# Patient Record
Sex: Male | Born: 1940 | Race: White | Hispanic: No | Marital: Single | Smoking: Former smoker
Health system: Southern US, Community
[De-identification: ages and names within clinical notes are randomized; demographics above are authoritative.]

## PROBLEM LIST (undated history)

## (undated) DIAGNOSIS — K220 Achalasia of cardia: Secondary | ICD-10-CM

## (undated) DIAGNOSIS — J189 Pneumonia, unspecified organism: Secondary | ICD-10-CM

## (undated) HISTORY — DX: Pneumonia, unspecified organism: J18.9

## (undated) HISTORY — DX: Achalasia of cardia: K22.0

---

## 2017-12-13 ENCOUNTER — Other Ambulatory Visit (INDEPENDENT_AMBULATORY_CARE_PROVIDER_SITE_OTHER): Payer: Medicare Other

## 2017-12-13 ENCOUNTER — Ambulatory Visit (INDEPENDENT_AMBULATORY_CARE_PROVIDER_SITE_OTHER)
Admission: RE | Admit: 2017-12-13 | Discharge: 2017-12-13 | Disposition: A | Discharge status: Home / Self Care | Payer: Medicare Other | Source: Ambulatory Visit | Attending: Internal Medicine | Admitting: Internal Medicine

## 2017-12-13 ENCOUNTER — Ambulatory Visit (INDEPENDENT_AMBULATORY_CARE_PROVIDER_SITE_OTHER): Payer: Medicare Other | Admitting: Internal Medicine

## 2017-12-13 ENCOUNTER — Encounter: Payer: Self-pay | Admitting: Internal Medicine

## 2017-12-13 VITALS — BP 120/76 | HR 55 | Ht 66.0 in | Wt 133.0 lb

## 2017-12-13 DIAGNOSIS — J479 Bronchiectasis, uncomplicated: Secondary | ICD-10-CM | POA: Diagnosis not present

## 2017-12-13 LAB — CBC WITH DIFFERENTIAL/PLATELET
BASOS ABS: 0.1 10*3/uL (ref 0.0–0.1)
Basophils Relative: 0.7 % (ref 0.0–3.0)
EOS ABS: 0.3 10*3/uL (ref 0.0–0.7)
Eosinophils Relative: 3.7 % (ref 0.0–5.0)
HCT: 39.9 % (ref 39.0–52.0)
Hemoglobin: 13.1 g/dL (ref 13.0–17.0)
LYMPHS ABS: 2.6 10*3/uL (ref 0.7–4.0)
Lymphocytes Relative: 32.6 % (ref 12.0–46.0)
MCHC: 32.7 g/dL (ref 30.0–36.0)
MCV: 90.8 fl (ref 78.0–100.0)
MONO ABS: 0.6 10*3/uL (ref 0.1–1.0)
MONOS PCT: 7.9 % (ref 3.0–12.0)
NEUTROS ABS: 4.4 10*3/uL (ref 1.4–7.7)
NEUTROS PCT: 55.1 % (ref 43.0–77.0)
PLATELETS: 232 10*3/uL (ref 150.0–400.0)
RBC: 4.39 Mil/uL (ref 4.22–5.81)
RDW: 15 % (ref 11.5–15.5)
WBC: 8 10*3/uL (ref 4.0–10.5)

## 2017-12-13 LAB — SEDIMENTATION RATE: SED RATE: 36 mm/h — AB (ref 0–20)

## 2017-12-13 NOTE — Patient Instructions (Addendum)
GERD (REFLUX)  is an extremely common cause of respiratory symptoms just like yours , many times with no obvious heartburn at all.    It can be treated with medication, but also with lifestyle changes including elevation of the head of your bed (ideally with 6 inch  bed blocks),  Smoking cessation, avoidance of late meals, excessive alcohol, and avoid fatty foods, chocolate, peppermint, colas, red wine, and acidic juices such as orange juice.  NO MINT OR MENTHOL PRODUCTS SO NO COUGH DROPS   USE SUGARLESS CANDY INSTEAD (Jolley ranchers or Stover's or Life Savers) or even ice chips will also do - the key is to swallow to prevent all throat clearing. NO OIL BASED VITAMINS - use powdered substitutes.   Pantoprazole (protonix) 40 mg   Take  30-60 min before first meal of the day and Pepcid (famotidine)  20 mg / zantac 150 one @  bedtime until return to office - this is the best way to tell whether stomach acid is contributing to your problem.      Bronchiectasis =   you have scarring of your bronchial tubes which means that they don't function perfectly normally and mucus tends to pool in certain areas of your lung which can cause pneumonia and further scarring of your lung and bronchial tubes  Whenever you develop cough congestion take mucinex or mucinex dm > these will help keep the mucus loose and flowing but if your condition worsens you need to seek help immediately preferably here or somewhere inside the [facility] system to compare xrays ( worse = darker or bloody mucus or pain on breathing in)     Please remember to go to the lab and x-ray department downstairs in the basement  for your tests - we will call you with the results when they are available.     Please schedule a follow up office visit in 6 weeks, call sooner if needed pfts  On return

## 2017-12-13 NOTE — Progress Notes (Signed)
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 80% better   With the residual seasonal rhinitis fall = spring s tendency to sinus  infections s asthma or need for inhalers  but around age 77 onset dysphagia > dx achalasia [facility] s/p dilations and maint on gerd rx / careful with diet >>  2016 noted variable avg qod sensation of needing to cough when bending over = a tsp or two white thick mucus > CT chest 09/07/14 bilateral lower lobe bronchiectasis but no change in meds then really bad cough mid August 2019 and gen chest tightness > dx as pna rx with 2 different  abx and feeling better but still abn cxr 11/28/17 so referred to pulmonary clinic.     History of Present Illness  78/06/2017  Pulmonary / 1st office eval  Chief Complaint  Patient presents with  . Pulmonary Consult    Self referral.  Pt states he was dxed with PNA approx 6 wks ago. He states today he feels "the best I have felt in a year". He has minimal congestion "in my bronchial area"- non coughing up anything.   Dyspnea:  basoon capacity "75%" of what it used to be /  o/w Not limited by breathing from desired activities   Cough: less than it's been in a year Sleep: L side/ 6 in blocks 2 pillows SABA use: once a week for tightness    No obvious day to day or daytime variability or assoc excess/ purulent sputum or mucus plugs or hemoptysis or cp or chest tightness, subjective wheeze or overt sinus or hb symptoms.   Sleeping as above  without nocturnal  or early am exacerbation  of respiratory  c/o's or need for noct saba. Also denies any obvious fluctuation of symptoms with weather or environmental changes or other aggravating or alleviating factors except as outlined above   No unusual exposure hx or h/o childhood  pna/ asthma or knowledge of premature birth.  Current Allergies, Complete Past Medical History, Past Surgical History, Family History, and Social History were reviewed in [facility] record.  ROS  The following are not active complaints unless bolded Hoarseness, sore throat, dysphagia, dental problems, itching, sneezing,  nasal congestion or discharge of excess mucus or purulent secretions, ear ache,   fever, chills, sweats, unintended wt loss or wt gain, classically pleuritic or exertional cp,  orthopnea pnd or arm/hand swelling  or leg swelling, presyncope, palpitations, abdominal pain, anorexia, nausea, vomiting, diarrhea  or change in bowel habits or change in bladder habits, change in stools or change in urine, dysuria, hematuria,  rash, arthralgias, visual complaints, headache, numbness, weakness or ataxia or problems with walking or coordination,  change in mood or  memory.             Past Medical History:  Diagnosis Date  . Achalasia   . PNA (pneumonia)     Outpatient Medications Prior to Visit  Medication Sig Dispense Refill  . albuterol (PROVENTIL HFA;VENTOLIN HFA) 108 (90 Base) MCG/ACT inhaler Inhale 2 puffs into the lungs every 4 (four) hours as needed.    Marland Kitchen aspirin EC 81 MG tablet Take 1 tablet by mouth daily.    Marland Kitchen atenolol (TENORMIN) 25 MG tablet Take  25 mg by mouth 2 (two) times daily.    Marland Kitchen atorvastatin (LIPITOR) 80 MG tablet Take 1 tablet by mouth daily.    . Cholecalciferol (VITAMIN D-1000 MAX ST) 1000 units tablet Take 1 capsule by mouth daily.    . IRON PO Take 1 tablet by mouth every 7 (seven) days.    Marland Kitchen KRILL OIL PO Take 1 capsule by mouth daily.    . multivitamin-lutein (OCUVITE-LUTEIN) CAPS capsule Take 1 capsule by mouth daily.    Marland Kitchen PAIN RELIEVER EXTRA STRENGTH 500 MG tablet Take 1 tablet by mouth every 6 (six) hours as needed.  0  . pantoprazole (PROTONIX) 40 MG tablet Take 1 tablet by mouth daily.    . potassium chloride (K-DUR,KLOR-CON)  10 MEQ tablet Take 1 tablet by mouth daily.  0  . ranitidine (ZANTAC) 150 MG tablet Take 1 tablet by mouth daily.               Objective:     BP 120/76 (BP Location: Left Arm, Cuff Size: Normal)   Pulse (!) 55   Ht 5\' 6"  (1.676 m)   Wt 133 lb (60.3 kg)   SpO2 99%   BMI 21.47 kg/m   SpO2: 99 %  RA   HEENT: nl dentition, turbinates bilaterally, and oropharynx. Nl external ear canals without cough reflex   NECK :  without JVD/Nodes/TM/ nl carotid upstrokes bilaterally   LUNGS: no acc muscle use,  Nl contour chest with minimal insp / exp rhonchi  bilaterally without cough on insp or exp maneuvers   CV:  RRR  no s3 or murmur or increase in P2, and  Trace pedal edema bilaterally   ABD:  soft and nontender with nl inspiratory excursion in the supine position. No bruits or organomegaly appreciated, bowel sounds nl  MS:  Nl gait/ ext warm without deformities, calf tenderness, cyanosis or clubbing No obvious joint restrictions   SKIN: warm and dry without lesions    NEURO:  alert, approp, nl sensorium with  no motor or cerebellar deficits apparent.     Labs ordered 12/13/2017   Alpha one screen, quant Ig's,  Quant GOLD Tb, allergy profile    CXR PA and Lateral:   12/13/2017 :    I personally reviewed images and agree with radiology impression as follows:    Mild bibasilar subsegmental atelectasis/infiltrate again noted. A component of bronchiectasis may be present. Tiny right pleural effusion again noted. Is noted on prior exam.         Assessment   Bronchiectasis without complication (HCC)  CT chest 09/07/14  bilateral lower lobe bronchiectasis assoc with obvious achalasia  - 12/13/2017 Alpha one screen>> - 12/13/2017  quant IG's wnl >> - 12/13/2017   Quant GOLD Tb >>  -  Allergy profile 12/13/2017 >  Eos 0.3 /  IgE  Pending    Classic CT and chronic cough typical of bronchiectasis assoc with GERD/ achalasia which was improved with egd/ dilation but this did nothing  for the reflux problem which likely persists   Therefore rec max rx for gerd - consider repeating GI w/u at some point but not needed now  Reviewed pathophysiology of bronchiectasis using escalator analogy   Check above labs w/a to complete the w/u   Return for full pfts consideration for maint bronchodilators       Total time devoted to counseling  > 50 % of initial 60 min office visit:  review case with pt/ discussion of options/alternatives/  personally creating written customized instructions  in presence of pt  then going over those specific  Instructions directly with the pt including how to use all of the meds but in particular covering each new medication in detail and the difference between the maintenance= "automatic" meds and the prns using an action plan format for the latter (If this problem/symptom => do that organization reading Left to right).  Please see AVS from this visit for a full list of these instructions which I personally wrote for this pt and  are unique to this visit.        Sandrea Hughs, MD 12/13/2017

## 2017-12-14 ENCOUNTER — Encounter: Payer: Self-pay | Admitting: Internal Medicine

## 2017-12-14 NOTE — Assessment & Plan Note (Addendum)
CT chest 09/07/14  bilateral lower lobe bronchiectasis assoc with obvious achalasia  - 12/13/2017 Alpha one screen>> - 12/13/2017  quant IG's wnl >> - 12/13/2017   Quant GOLD Tb >>  -  Allergy profile 12/13/2017 >  Eos 0.3 /  IgE  Pending    Classic CT and chronic cough typical of bronchiectasis assoc with GERD/ achalasia which was improved with egd/ dilation but this did nothing for the reflux problem which likely persists   Therefore rec max rx for gerd - consider repeating GI w/u at some point but not needed now  Reviewed pathophysiology of bronchiectasis using escalator analogy   Check above labs w/a to complete the w/u   Return for full pfts consideration for maint bronchodilators       Total time devoted to counseling  > 50 % of initial 60 min office visit:  review case with pt/ discussion of options/alternatives/ personally creating written customized instructions  in presence of pt  then going over those specific  Instructions directly with the pt including how to use all of the meds but in particular covering each new medication in detail and the difference between the maintenance= "automatic" meds and the prns using an action plan format for the latter (If this problem/symptom => do that organization reading Left to right).  Please see AVS from this visit for a full list of these instructions which I personally wrote for this pt and  are unique to this visit.

## 2017-12-16 NOTE — Progress Notes (Signed)
Spoke with pt and notified of results per Dr. Wert. Pt verbalized understanding and denied any questions. 

## 2017-12-19 LAB — INTERPRETATION:

## 2017-12-19 LAB — RESPIRATORY ALLERGY PROFILE REGION II ~~LOC~~
Allergen, A. alternata, m6: <0.1 kU/L
Allergen, Cedar tree, t12: <0.1 kU/L
Allergen, Comm Silver Birch, t9: <0.1 kU/L
Allergen, Cottonwood, t14: 0.12 kU/L — ABNORMAL HIGH
Allergen, D pternoyssinus,d7: <0.1 kU/L
Allergen, Mouse Urine Protein, e78: <0.1 kU/L
Allergen, Mulberry, t76: <0.1 kU/L
Allergen, Oak,t7: 0.11 kU/L — ABNORMAL HIGH
Aspergillus fumigatus, m3: <0.1 kU/L
BOX ELDER: 0.2 kU/L — AB
Bermuda Grass: 0.85 kU/L — ABNORMAL HIGH
CLASS: 0
CLASS: 0
CLASS: 0
CLASS: 0
CLASS: 0
CLASS: 0
CLASS: 0
CLASS: 0
CLASS: 0
COMMON RAGWEED (SHORT) (W1) IGE: 0.16 kU/L — AB
Class: 0
Class: 0
Class: 0
Class: 0
Class: 0
Class: 0
Class: 0
Class: 0
Class: 0
Class: 0
Class: 0
Class: 0
Class: 2
Class: 2
Class: 3
Cockroach: <0.1 kU/L
ELM IGE: 0.15 kU/L — AB
IGE (IMMUNOGLOBULIN E), SERUM: 98 kU/L (ref <=114)
JOHNSON GRASS: 0.8 kU/L — AB
Pecan/Hickory Tree IgE: <0.1 kU/L
Rough Pigweed  IgE: 0.1 kU/L — ABNORMAL HIGH
Sheep Sorrel IgE: <0.1 kU/L
Timothy Grass: 4.55 kU/L — ABNORMAL HIGH

## 2017-12-19 LAB — QUANTIFERON-TB GOLD PLUS
NIL: 0.02 IU/mL
QuantiFERON-TB Gold Plus: NEGATIVE
TB1-NIL: 0 [IU]/mL
TB2-NIL: 0 [IU]/mL

## 2017-12-19 LAB — IGG, IGA, IGM
IGG (IMMUNOGLOBIN G), SERUM: 1125 mg/dL (ref 600–1540)
IMMUNOGLOBULIN A: 607 mg/dL — AB (ref 20–320)
IgM, Serum: 37 mg/dL — ABNORMAL LOW (ref 50–300)

## 2017-12-19 LAB — ALPHA-1 ANTITRYPSIN PHENOTYPE: A-1 Antitrypsin, Ser: 164 mg/dL (ref 83–199)

## 2018-01-27 ENCOUNTER — Ambulatory Visit: Payer: Medicare Other | Admitting: Internal Medicine

## 2018-01-27 ENCOUNTER — Ambulatory Visit (INDEPENDENT_AMBULATORY_CARE_PROVIDER_SITE_OTHER): Payer: Medicare Other | Admitting: Internal Medicine

## 2018-01-27 ENCOUNTER — Encounter: Payer: Self-pay | Admitting: Internal Medicine

## 2018-01-27 VITALS — BP 102/64 | HR 67 | Ht 69.0 in | Wt 139.4 lb

## 2018-01-27 DIAGNOSIS — J479 Bronchiectasis, uncomplicated: Secondary | ICD-10-CM

## 2018-01-27 LAB — PULMONARY FUNCTION TEST
DL/VA % PRED: 85 %
DL/VA: 3.85 ml/min/mmHg/L
DLCO cor % pred: 91 %
DLCO cor: 28.32 ml/min/mmHg
DLCO unc % pred: 87 %
DLCO unc: 27.05 ml/min/mmHg
FEF 25-75 PRE: 2.39 L/s
FEF 25-75 Post: 2.17 L/sec
FEF2575-%CHANGE-POST: -9 %
FEF2575-%Pred-Post: 109 %
FEF2575-%Pred-Pre: 120 %
FEV1-%Change-Post: -2 %
FEV1-%PRED-PRE: 106 %
FEV1-%Pred-Post: 103 %
FEV1-PRE: 3 L
FEV1-Post: 2.92 L
FEV1FVC-%CHANGE-POST: 1 %
FEV1FVC-%Pred-Pre: 105 %
FEV6-%CHANGE-POST: -3 %
FEV6-%PRED-POST: 103 %
FEV6-%PRED-PRE: 106 %
FEV6-Post: 3.82 L
FEV6-Pre: 3.94 L
FEV6FVC-%Change-Post: 0 %
FEV6FVC-%PRED-PRE: 106 %
FEV6FVC-%Pred-Post: 107 %
FVC-%CHANGE-POST: -3 %
FVC-%PRED-POST: 96 %
FVC-%Pred-Pre: 100 %
FVC-Post: 3.82 L
FVC-Pre: 3.96 L
POST FEV6/FVC RATIO: 100 %
Post FEV1/FVC ratio: 77 %
Pre FEV1/FVC ratio: 76 %
Pre FEV6/FVC Ratio: 99 %
RV % PRED: 115 %
RV: 2.95 L
TLC % pred: 111 %
TLC: 7.65 L

## 2018-01-27 NOTE — Patient Instructions (Addendum)
For cough > mucinex or mucinex dm up to 1200 mg every 12 hours as needed    For breathing  Only use your albuterol as a rescue medication to be used if you can't catch your breath by resting or doing a relaxed purse lip breathing pattern.  - The less you use it, the better it will work when you need it. - Ok to use up to 2 puffs  every 4 hours if you must but call for immediate appointment if use goes up over your usual need - Don't leave home without it !!  (think of it like the spare tire for your car)    Please schedule a follow up visit in 6  months but call sooner if needed

## 2018-01-27 NOTE — Assessment & Plan Note (Addendum)
CT chest 09/07/14 bilateral lower lobe bronchiectasis assoc with achalasia  - 12/13/2017 Alpha one screen  164  MM  - 12/13/2017  quant IGs :  wnl - 12/13/2017   Quant GOLD Tb :  Neg  -  Allergy profile 12/13/2017 >  Eos 0.3 /  IgE 98  RAST pos ragweed, grass, trees   - PFT's  01/27/2018  FEV1 3.00 (106 % ) ratio 72  p no % improvement from saba p nothing prior to study with DLCO  87/91c % corrects to 85 % for alv volume     I had an extended summary discussion with the patient reviewing all relevant studies completed to date and  lasting 15 to 20 minutes of a 25 minute visit on the following issues:   1) again reviewed pathophysiology of bronchiectasis which in case seems closely related by achalasia historically but now actually may contribute to gerd (cough causing gerd) so rx with mucinex or mucinex dm prn and add flutter next if not responding or freq need/flares which he assures me has not been the case to date.  2) Dilation helps the swallowing but not the assoc gerd so diet/ acid suppression are needed chronically   3) in absence of any demonstrable airflow obst at all ok to just use saba prn in this setting and no escalation of care needed   4) f/u q 6 m approp but reviewed indications for prn pulmonary f/u in meantime for flares which are likely with URI's or any further issues with non-acid gerd as we don't have meds available for this issus   Each maintenance medication was reviewed in detail including most importantly the difference between maintenance and as needed and under what circumstances the prns are to be used.  Please see AVS for specific  Instructions which are unique to this visit and I personally typed out  which were reviewed in detail in writing with the patient and a copy provided.

## 2018-01-27 NOTE — Progress Notes (Signed)
Pt completed full PFT today. 

## 2018-01-27 NOTE — Progress Notes (Signed)
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% better   With the residual seasonal rhinitis fall = spring s tendency to sinus  infections s asthma or need for inhalers  but around age 89 onset dysphagia > dx achalasia [facility] s/p dilations and maint on gerd rx / careful with diet >>  2016 noted variable avg qod sensation of needing to cough when bending over = a tsp or two white thick mucus > CT chest 09/07/14 bilateral lower lobe bronchiectasis but no change in meds then really bad cough mid August 2019 and gen chest tightness > dx as pna rx with 2 different  abx and feeling better but still abn cxr 11/28/17 so referred to pulmonary clinic.     History of Present Illness  12/13/2017  Pulmonary / 1st office eval  Chief Complaint  Patient presents with  . Pulmonary Consult    Self referral.  Pt states he was dxed with PNA approx 6 wks ago. He states today he feels "the best I have felt in a year". He has minimal congestion "in my bronchial area"- non coughing up anything.   Dyspnea:  basoon capacity "75%" of what it used to be /  o/w  Not limited by breathing from desired activities   Cough: less than it's been in a year Sleep: L side/ 6 in blocks 2 pillows SABA use: once a week for tightness  rec GERD (REFLUX)  is an extremely common cause of respiratory symptoms just like yours , many times with no obvious heartburn at all.  Pantoprazole (protonix) 40 mg   Take  30-60 min before first meal of the day and Pepcid (famotidine)  20 mg / zantac 150 one @  bedtime until return to office - this is the best way to tell whether stomach acid is contributing to your problem.       01/27/2018  f/u ov/Elley Harp re: bronchiectasis related to achalasia / better on max gerd rx  Chief  Complaint  Patient presents with  . Follow-up    PFT's done today  Dyspnea:  Some better with bassoon - Not limited by breathing from desired activities   Cough: min mucoid not needing any cough meds Sleeping: 6 in blocks / 2 pillow / no am flare SABA use: rarely 02: no     No obvious day to day or daytime variability or assoc  purulent sputum or mucus plugs or hemoptysis or cp or chest tightness, subjective wheeze or overt sinus or hb symptoms.   Sleeping fine as above without nocturnal  or early am exacerbation  of respiratory  c/o's or need for noct saba. Also denies any obvious fluctuation of symptoms with weather or environmental changes or other aggravating or alleviating factors except as outlined above   No unusual exposure hx or h/o childhood pna/ asthma or knowledge of premature birth.  Current Allergies, Complete Past Medical History, Past Surgical History, Family History, and Social History were reviewed in [facility] electronic medical record.  ROS  The following are not active complaints unless bolded Hoarseness, sore throat, dysphagia, dental problems, itching, sneezing,  nasal congestion or discharge of excess mucus or purulent secretions, ear ache,   fever, chills, sweats, unintended wt loss or wt  gain, classically pleuritic or exertional cp,  orthopnea pnd or arm/hand swelling  or leg swelling minimal, presyncope, palpitations, abdominal pain, anorexia, nausea, vomiting, diarrhea  or change in bowel habits or change in bladder habits, change in stools or change in urine, dysuria, hematuria,  rash, arthralgias, visual complaints, headache, numbness, weakness or ataxia or problems with walking or coordination,  change in mood or  memory.        Current Meds  Medication Sig  . albuterol (PROVENTIL HFA;VENTOLIN HFA) 108 (90 Base) MCG/ACT inhaler Inhale 2 puffs into the lungs every 4 (four) hours as needed.  Marland Kitchen aspirin EC 81 MG tablet Take 1 tablet by mouth daily.  Marland Kitchen  atenolol (TENORMIN) 25 MG tablet Take 25 mg by mouth 2 (two) times daily.  Marland Kitchen atorvastatin (LIPITOR) 80 MG tablet Take 1 tablet by mouth daily.  . Cholecalciferol (VITAMIN D-1000 MAX ST) 1000 units tablet Take 1 capsule by mouth daily.  . IRON PO Take 1 tablet by mouth every 7 (seven) days.  . multivitamin-lutein (OCUVITE-LUTEIN) CAPS capsule Take 1 capsule by mouth daily.  Marland Kitchen PAIN RELIEVER EXTRA STRENGTH 500 MG tablet Take 1 tablet by mouth every 6 (six) hours as needed.  . pantoprazole (PROTONIX) 40 MG tablet Take 1 tablet by mouth daily.  . potassium chloride (K-DUR,KLOR-CON) 10 MEQ tablet Take 1 tablet by mouth daily.  . ranitidine (ZANTAC) 150 MG tablet Take 1 tablet by mouth daily.                      Objective:     amb wm nad    Wt Readings from Last 3 Encounters:  01/27/18 139 lb 6.4 oz (63.2 kg)  12/13/17 133 lb (60.3 kg)     Vital signs reviewed - Note on arrival 02 sats  98% on RA        HEENT: nl dentition, turbinates bilaterally, and oropharynx. Nl external ear canals without cough reflex   NECK :  without JVD/Nodes/TM/ nl carotid upstrokes bilaterally   LUNGS: no acc muscle use,  Nl contour chest which is clear to A and P bilaterally without cough on insp or exp maneuvers   CV:  RRR  no s3 or murmur or increase in P2, and  Trace pedal edema bilaterally   ABD:  soft and nontender with nl inspiratory excursion in the supine position. No bruits or organomegaly appreciated, bowel sounds nl  MS:  Nl gait/ ext warm without deformities, calf tenderness, cyanosis or clubbing No obvious joint restrictions   SKIN: warm and dry without lesions    NEURO:  alert, approp, nl sensorium with  no motor or cerebellar deficits apparent.          Assessment

## 2018-07-28 ENCOUNTER — Ambulatory Visit: Payer: Medicare Other | Admitting: Internal Medicine

## 2018-09-22 ENCOUNTER — Ambulatory Visit: Payer: Medicare Other | Admitting: Internal Medicine

## 2018-11-18 ENCOUNTER — Encounter: Payer: Self-pay | Admitting: Internal Medicine

## 2018-11-18 ENCOUNTER — Ambulatory Visit (INDEPENDENT_AMBULATORY_CARE_PROVIDER_SITE_OTHER): Payer: Medicare Other | Admitting: Internal Medicine

## 2018-11-18 ENCOUNTER — Other Ambulatory Visit: Payer: Self-pay

## 2018-11-18 ENCOUNTER — Ambulatory Visit (INDEPENDENT_AMBULATORY_CARE_PROVIDER_SITE_OTHER): Payer: Medicare Other

## 2018-11-18 DIAGNOSIS — J479 Bronchiectasis, uncomplicated: Secondary | ICD-10-CM

## 2018-11-18 MED ORDER — ALBUTEROL SULFATE HFA 108 (90 BASE) MCG/ACT IN AERS: 2.0000 | INHALATION_SPRAY | RESPIRATORY_TRACT | 1 refills | Status: AC | PRN

## 2018-11-18 NOTE — Progress Notes (Signed)
Called and left detailed msg on machine ok per DPR.

## 2018-11-18 NOTE — Assessment & Plan Note (Signed)
Onset was 2016 in setting of achalasia since around [place] chest 09/07/14 bilateral lower lobe bronchiectasis assoc with achalasia  - 12/13/2017 Alpha one screen  164  MM  - 12/13/2017  quant IGs :  wnl - 12/13/2017   Quant GOLD Tb :  Neg  -  Allergy profile 12/13/2017 >  Eos 0.3 /  IgE 98  RAST pos ragweed, grass, trees   - PFT's  01/27/2018  FEV1 3.00 (106 % ) ratio 72  p no % improvement from saba p nothing prior to study with DLCO  87/91c % corrects to 85 % for alv volume    - The proper method of use, as well as anticipated side effects, of a metered-dose inhaler were discussed and demonstrated to the patient.   Also reviewed:  If your breathing worsens or you need to use your rescue inhaler more than twice weekly or wake up more than twice a month with any respiratory symptoms or require more than two rescue inhalers per year, we need to see you right away because this means we're not controlling the underlying problem (inflammation) adequately.  Rescue inhalers (albuterol) do not control inflammation and overuse can lead to unnecessary and costly consequences.  They can make you feel better temporarily but eventually they will quit working effectively much as sleep aids lead to more insomnia if used regularly.     Presently only using saba seasonally and swallowing better so bronchiectatic component also in good shape with no evidence of clinical or radiographic progression.   >>> f/u can be yearly, sooner prn    I had an extended discussion with the patient reviewing all relevant studies completed to date and  lasting 15 to 20 minutes of a 25 minute visit    I performed detailed device teaching using a teach back method which extended face to face time for this visit (see above)  Each maintenance medication was reviewed in detail including emphasizing most importantly the difference between maintenance and prns and under what circumstances the prns are to be triggered using an action  plan format that is not reflected in the computer generated alphabetically organized AVS which I have not found useful in most complex patients, especially with respiratory illnesses  Please see AVS for specific instructions unique to this visit that I personally wrote and verbalized to the the pt in detail and then reviewed with pt  by my nurse highlighting any  changes in therapy recommended at today's visit to their plan of care.

## 2018-11-18 NOTE — Patient Instructions (Signed)
No change in recommendations  Please remember to go to the  x-ray department  for your tests - we will call you with the results when they are available     Please schedule a follow up visit in 12  months but call sooner if needed    

## 2018-11-18 NOTE — Progress Notes (Signed)
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 79% better   With the residual seasonal rhinitis fall = spring s tendency to sinus  infections s asthma or need for inhalers  but around age 78 onset dysphagia > dx achalasia [facility] s/p dilations and maint on gerd rx / careful with diet >>  2016 noted variable avg qod sensation of needing to cough when bending over = a tsp or two white thick mucus > CT chest 09/07/14 bilateral lower lobe bronchiectasis but no change in meds then really bad cough mid August 2019 and gen chest tightness > dx as pna rx with 2 different  abx and feeling better but still abn cxr 11/28/17 so referred to pulmonary clinic.     History of Present Illness  12/13/2017  Pulmonary / 1st office eval  Chief Complaint  Patient presents with  . Pulmonary Consult    Self referral.  Pt states he was dxed with PNA approx 6 wks ago. He states today he feels "the best I have felt in a year". He has minimal congestion "in my bronchial area"- non coughing up anything.   Dyspnea:  basoon capacity "75%" of what it used to be /  o/w  Not limited by breathing from desired activities   Cough: less than it's been in a year Sleep: L side/ 6 in blocks 2 pillows SABA use: once a week for tightness  rec GERD (REFLUX)  is an extremely common cause of respiratory symptoms just like yours , many times with no obvious heartburn at all.  Pantoprazole (protonix) 40 mg   Take  30-60 min before first meal of the day and Pepcid (famotidine)  20 mg / zantac 150 one @  bedtime until return to office - this is the best way to tell whether stomach acid is contributing to your problem.       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 Follow-up    PFT's done today  Dyspnea:  Some better with bassoon - Not limited by breathing from desired activities   Cough: min mucoid not needing any cough meds Sleeping: 6 in blocks / 2 pillow / no am flare SABA use: rarely 02: no   rec For cough > mucinex or mucinex dm up to 1200 mg every 12 hours as needed  For breathing  Only use your albuterol as a rescue medication     11/18/2018  f/u ov/Evita Merida re: bronchiectasis/ [facility]/ [facility]  Chief Complaint  Patient presents with  . Follow-up    Breathing is overall doing well. He has occ cough with white to yellow sputum.   Dyspnea:  Not limited by breathing from desired activities   Cough: sporadic maybe a tbsp max foamy sev times a week not related to meals as far as he can tell  Sleeping: 10-20  degrees blocks  SABA use: only during pollen season 02: none  No obvious day to day or daytime variability or assoc excess/ purulent sputum or mucus plugs or hemoptysis or cp or chest tightness, subjective wheeze or overt sinus or hb symptoms.   Sleeping as above  without nocturnal  or early am exacerbation  of respiratory  c/o's or need for noct saba. Also denies any obvious fluctuation of symptoms with weather or environmental changes or other aggravating or alleviating factors except as outlined above   No unusual exposure hx or h/o childhood pna/ asthma or knowledge of premature birth.  Current Allergies, Complete Past Medical History, Past Surgical History, Family History, and Social History were reviewed in [facility] electronic medical record.  ROS  The following are not active complaints unless bolded Hoarseness, sore throat, dysphagia, dental problems, itching, sneezing,  nasal congestion or discharge of excess mucus or purulent secretions, ear ache,   fever, chills, sweats, unintended wt loss or wt gain, classically pleuritic or exertional cp,  orthopnea pnd or arm/hand  swelling  or leg swelling, presyncope, palpitations, abdominal pain, anorexia, nausea, vomiting, diarrhea  or change in bowel habits or change in bladder habits, change in stools or change in urine, dysuria, hematuria,  rash, arthralgias, visual complaints, headache, numbness, weakness or ataxia or problems with walking or coordination,  change in mood or  memory.        Current Meds  Medication Sig  . albuterol (PROVENTIL HFA;VENTOLIN HFA) 108 (90 Base) MCG/ACT inhaler Inhale 2 puffs into the lungs every 4 (four) hours as needed.  Marland Kitchen. aspirin EC 81 MG tablet Take 1 tablet by mouth daily.  Marland Kitchen. atenolol (TENORMIN) 25 MG tablet Take 25 mg by mouth 2 (two) times daily.  Marland Kitchen. atorvastatin (LIPITOR) 80 MG tablet Take 1 tablet by mouth daily.  . IRON PO Take 1 tablet by mouth every 7 (seven) days.  . multivitamin-lutein (OCUVITE-LUTEIN) CAPS capsule Take 1 capsule by mouth daily.  . pantoprazole (PROTONIX) 40 MG tablet Take 1 tablet by mouth daily.  . potassium chloride (K-DUR,KLOR-CON) 10 MEQ tablet Take 1 tablet by mouth daily.                        Objective:       amb wm    Wt Readings from Last 3 Encounters:  11/18/18 139 lb (63 kg)  01/27/18 139 lb 6.4 oz (63.2 kg)  12/13/17 133 lb (60.3 kg)     Vital signs reviewed - Note on arrival 02 sats  100% on RA      HEENT : pt wearing mask not removed for exam due to covid - 19 concerns.    NECK :  without JVD/Nodes/TM/ nl carotid upstrokes bilaterally   LUNGS: no acc muscle use,  Nl contour chest which is clear to A and P bilaterally without cough on insp or exp maneuvers   CV:  RRR  no s3 or murmur or increase in P2, and no edema   ABD:  soft and nontender with nl inspiratory excursion in the supine position. No bruits or organomegaly appreciated, bowel sounds nl  MS:  Nl gait/ ext warm without deformities, calf tenderness, cyanosis or clubbing No obvious joint restrictions   SKIN: warm and dry without lesions    NEURO:   alert, approp, nl sensorium with  no motor or cerebellar deficits apparent.        CXR PA and Lateral:   11/18/2018 :    I personally reviewed images and agree with radiology impression as follows:   Tram track opacities in the lower lungs bilaterally compatible with bronchiectasis, similar on the right and slightly more prominent on the left. No acute consolidative airspace disease to suggest pneumonia.  My  impression: no progression of dz       Assessment

## 2019-11-19 ENCOUNTER — Ambulatory Visit: Payer: Medicare Other | Admitting: Internal Medicine

## 2019-11-24 ENCOUNTER — Ambulatory Visit: Payer: Medicare Other | Admitting: Internal Medicine

## 2019-11-26 ENCOUNTER — Ambulatory Visit (INDEPENDENT_AMBULATORY_CARE_PROVIDER_SITE_OTHER): Payer: Medicare Other | Admitting: Internal Medicine

## 2019-11-26 ENCOUNTER — Encounter: Payer: Self-pay | Admitting: Internal Medicine

## 2019-11-26 ENCOUNTER — Other Ambulatory Visit: Payer: Self-pay

## 2019-11-26 ENCOUNTER — Ambulatory Visit (INDEPENDENT_AMBULATORY_CARE_PROVIDER_SITE_OTHER): Payer: Medicare Other

## 2019-11-26 DIAGNOSIS — J479 Bronchiectasis, uncomplicated: Secondary | ICD-10-CM

## 2019-11-26 NOTE — Patient Instructions (Signed)
Please remember to go to the x-ray department   for your tests - we will call you with the results when they are available.     Please schedule a follow up visit in  12 months but call sooner if needed.    

## 2019-11-26 NOTE — Progress Notes (Signed)
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 80% better   With the residual seasonal rhinitis fall = spring s tendency to sinus  infections s asthma or need for inhalers  but around age 79 onset dysphagia > dx achalasia [facility] s/p dilations and maint on gerd rx / careful with diet >>  2016 noted variable avg qod sensation of needing to cough when bending over = a tsp or two white thick mucus > CT chest 09/07/14 bilateral lower lobe bronchiectasis but no change in meds then really bad cough mid August 2019 and gen chest tightness > dx as pna rx with 2 different  abx and feeling better but still abn cxr 11/28/17 so referred to pulmonary clinic.     History of Present Illness  12/13/2017  Pulmonary / 1st office eval  Chief Complaint  Patient presents with  . Pulmonary Consult    Self referral.  Pt states he was dxed with PNA approx 6 wks ago. He states today he feels "the best I have felt in a year". He has minimal congestion "in my bronchial area"- non coughing up anything.   Dyspnea:  basoon capacity "75%" of what it used to be /  o/w  Not limited by breathing from desired activities   Cough: less than it's been in a year Sleep: L side/ 6 in blocks 2 pillows SABA use: once a week for tightness  rec GERD (REFLUX)  is an extremely common cause of respiratory symptoms just like yours , many times with no obvious heartburn at all.  Pantoprazole (protonix) 40 mg   Take  30-60 min before first meal of the day and Pepcid (famotidine)  20 mg / zantac 150 one @  bedtime until return to office - this is the best way to tell whether stomach acid is contributing to your problem.       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 Follow-up    PFT's done today  Dyspnea:  Some better with bassoon - Not limited by breathing from desired activities   Cough: min mucoid not needing any cough meds Sleeping: 6 in blocks / 2 pillow / no am flare SABA use: rarely 02: no   rec For cough > mucinex or mucinex dm up to 1200 mg every 12 hours as needed  For breathing  Only use your albuterol as a rescue medication     11/18/2018  f/u ov/Terry Sparks re: bronchiectasis/ [facility]/ [facility] ok  Chief Complaint  Patient presents with  . Follow-up    Breathing is overall doing well. He has occ cough with white to yellow sputum.   Dyspnea:  Not limited by breathing from desired activities   Cough: sporadic maybe a tbsp max foamy sev times a week not related to meals as far as he can tell  Sleeping: 10-20  degrees blocks  SABA use: only during pollen season 02: none rec No change rx   11/26/2019  f/u ov/Terry Sparks re: bronchiectasis / achalasia  Chief Complaint  Patient presents with  . Follow-up    Bronchiectasis w/o complication, no new issues  Dyspnea:  Walks 2  miles per day / 30 min / no hills  Cough: some sputum in am creamy  Sleeping: bed blocks and 2 pillows SABA use: very rarely 02: none    No obvious day to day or daytime variability or assoc excess/ purulent sputum or mucus plugs or hemoptysis or cp or chest tightness, subjective wheeze or overt sinus or hb symptoms.   Sleeping  without nocturnal   exacerbation  of respiratory  c/o's or need for noct saba. Also denies any obvious fluctuation of symptoms with weather or environmental changes or other aggravating or alleviating factors except as outlined above   No unusual exposure hx or h/o childhood pna/ asthma or knowledge of premature birth.  Current Allergies, Complete Past Medical History, Past Surgical History, Family History, and Social History were reviewed in [facility] record.  ROS  The following are  not active complaints unless bolded Hoarseness, sore throat, dysphagia, dental problems, itching, sneezing,  nasal congestion or discharge of excess mucus or purulent secretions, ear ache,   fever, chills, sweats, unintended wt loss or wt gain, classically pleuritic or exertional cp,  orthopnea pnd or arm/hand swelling  or leg swelling, presyncope, palpitations, abdominal pain, anorexia, nausea, vomiting, diarrhea  or change in bowel habits or change in bladder habits, change in stools or change in urine, dysuria, hematuria,  rash, arthralgias, visual complaints, headache, numbness, weakness or ataxia or problems with walking or coordination,  change in mood or  memory.        Current Meds  Medication Sig  . albuterol (VENTOLIN HFA) 108 (90 Base) MCG/ACT inhaler Inhale 2 puffs into the lungs every 4 (four) hours as needed.  Marland Kitchen aspirin EC 81 MG tablet Take 1 tablet by mouth daily.  Marland Kitchen atenolol (TENORMIN) 25 MG tablet Take 25 mg by mouth 2 (two) times daily.  Marland Kitchen atorvastatin (LIPITOR) 80 MG tablet Take 1 tablet by mouth daily.  . IRON PO Take 1 tablet by mouth every 7 (seven) days.  . multivitamin-lutein (OCUVITE-LUTEIN) CAPS capsule Take 1 capsule by mouth daily.  . pantoprazole (PROTONIX) 40 MG tablet Take 1 tablet by mouth daily.                            Objective:      Thin amb wm nad    11/26/2019        137  11/18/18 139 lb (63 kg)  01/27/18 139 lb 6.4 oz (63.2 kg)  12/13/17 133 lb (60.3 kg)      Vital signs reviewed  11/26/2019  - Note at rest 02 sats  97% on RA        HEENT : pt wearing mask not removed for exam due to covid -19 concerns.    NECK :  without JVD/Nodes/TM/ nl carotid upstrokes bilaterally   LUNGS: no acc muscle use,  Nl contour chest with minimal insp pops/squeaks bases bilaterally without cough on insp or exp maneuvers   CV:  RRR  no s3 or murmur or increase in P2, and no edema   ABD:  soft and nontender with nl inspiratory excursion in the supine  position. No bruits or organomegaly appreciated, bowel sounds nl  MS:  Nl gait/ ext warm without deformities, calf tenderness, cyanosis or clubbing No obvious joint restrictions   SKIN: warm and dry without lesions    NEURO:  alert, approp, nl sensorium with  no motor or cerebellar deficits apparent.  CXR PA and Lateral:   11/26/2019 :    I personally reviewed images and impression as follows:   Minimal bibasilar non specific markings c/w bronchiectasis                 Assessment

## 2019-11-27 ENCOUNTER — Encounter: Payer: Self-pay | Admitting: Internal Medicine

## 2019-11-27 NOTE — Assessment & Plan Note (Signed)
Onset was 2016 in setting of achalasia since around 1990   CT chest 09/07/14 bilateral lower lobe bronchiectasis assoc with achalasia  - 12/13/2017 Alpha one screen  164  MM  - 12/13/2017  quant IGs :  wnl - 12/13/2017   Quant GOLD Tb :  Neg  -  Allergy profile 12/13/2017 >  Eos 0.3 /  IgE 98  RAST pos ragweed, grass, trees   - PFT's  01/27/2018  FEV1 3.00 (106 % ) ratio 72  p no % improvement from saba p nothing prior to study with DLCO  87/91c % corrects to 85 % for alv volume    Bronchiectasis assoc with achalasia without obvious recurrent aspiration issues or airflow obst on pfts   >> f/u can be q 12 m, sooner prn           Each maintenance medication was reviewed in detail including emphasizing most importantly the difference between maintenance and prns and under what circumstances the prns are to be triggered using an action plan format where appropriate.  Total time for H and P, chart review, counseling,   and generating customized AVS unique to this office visit / charting = 20 min

## 2019-11-30 NOTE — Progress Notes (Signed)
Left detailed msg on machine with results ok per DPR

## 2020-11-30 ENCOUNTER — Ambulatory Visit: Payer: Medicare Other | Admitting: Internal Medicine

## 2020-12-05 ENCOUNTER — Ambulatory Visit: Payer: Medicare Other | Admitting: Internal Medicine

## 2020-12-16 ENCOUNTER — Other Ambulatory Visit: Payer: Self-pay

## 2020-12-16 ENCOUNTER — Encounter: Payer: Self-pay | Admitting: Internal Medicine

## 2020-12-16 ENCOUNTER — Ambulatory Visit (INDEPENDENT_AMBULATORY_CARE_PROVIDER_SITE_OTHER): Payer: Medicare Other | Admitting: Internal Medicine

## 2020-12-16 ENCOUNTER — Ambulatory Visit (INDEPENDENT_AMBULATORY_CARE_PROVIDER_SITE_OTHER): Payer: Medicare Other

## 2020-12-16 DIAGNOSIS — J479 Bronchiectasis, uncomplicated: Secondary | ICD-10-CM

## 2020-12-16 NOTE — Progress Notes (Signed)
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 80% better   With the residual seasonal rhinitis fall = spring s tendency to sinus  infections s asthma or need for inhalers  but around age 80 onset dysphagia > dx achalasia [facility] s/p dilations and maint on gerd rx / careful with diet >>  2016 noted variable avg qod sensation of needing to cough when bending over = a tsp or two white thick mucus > CT chest 09/07/14 bilateral lower lobe bronchiectasis but no change in meds then really bad cough mid August 2019 and gen chest tightness > dx as pna rx with 2 different  abx and feeling better but still abn cxr 11/28/17 so referred to pulmonary clinic.     History of Present Illness  12/13/2017  Pulmonary / 1st office eval  Chief Complaint  Patient presents with   Pulmonary Consult    Self referral.  Pt states he was dxed with PNA approx 6 wks ago. He states today he feels "the best I have felt in a year". He has minimal congestion "in my bronchial area"- non coughing up anything.   Dyspnea:  basoon capacity "75%" of what it used to be /  o/w  Not limited by breathing from desired activities   Cough: less than it's been in a year Sleep: L side/ 6 in blocks 2 pillows SABA use: once a week for tightness  rec GERD (REFLUX)  is an extremely common cause of respiratory symptoms just like yours , many times with no obvious heartburn at all.  Pantoprazole (protonix) 40 mg   Take  30-60 min before first meal of the day and Pepcid (famotidine)  20 mg / zantac 150 one @  bedtime until return to office - this is the best way to tell whether stomach acid is contributing to your problem.     11/26/2019  f/u ov/Terry Sparks re: bronchiectasis / achalasia  Chief Complaint  Patient presents with   Follow-up     Bronchiectasis w/o complication, no new issues  Dyspnea:  Walks 2 miles per day / 30 min / no hills  Cough: some sputum in am creamy  Sleeping: bed blocks and 2 pillows SABA use: very rarely 02: none  Rec No change rx    12/16/2020  f/u ov/Terry Sparks re: bronchiectasis /achalasia  maint on protonix ac   Chief Complaint  Patient presents with   Follow-up    More congestion than last office visit  Dyspnea:  walking 30 min daily no sob  Cough: after supper worse scant mucoid Sleeping: bed blocks 2 pillows cough does not wake him / no am flare SABA use: maybe twice a week seems to help tight sensation 02: none Covid status:   vax x 5    No obvious day to day or daytime variability or assoc excess/ purulent sputum or mucus plugs or hemoptysis or cp or chest tightness, subjective wheeze or overt sinus or hb symptoms.   Sleeping  without nocturnal  or early am exacerbation  of respiratory  c/o's or need for noct saba. Also denies any obvious fluctuation of symptoms with weather or environmental changes or other aggravating or alleviating factors except as outlined above   No unusual exposure hx or  h/o childhood pna/ asthma or knowledge of premature birth.  Current Allergies, Complete Past Medical History, Past Surgical History, Family History, and Social History were reviewed in [facility] record.  ROS  The following are not active complaints unless bolded Hoarseness, sore throat, dysphagia, dental problems, itching, sneezing,  nasal congestion or discharge of excess mucus or purulent secretions, ear ache,   fever, chills, sweats, unintended wt loss or wt gain, classically pleuritic or exertional cp,  orthopnea pnd or arm/hand swelling  or leg swelling, presyncope, palpitations, abdominal pain, anorexia, nausea, vomiting, diarrhea  or change in bowel habits or change in bladder habits, change in stools or change in urine, dysuria, hematuria,  rash, arthralgias, visual  complaints, headache, numbness, weakness or ataxia or problems with walking or coordination,  change in mood or  memory.        Current Meds  Medication Sig   albuterol (VENTOLIN HFA) 108 (90 Base) MCG/ACT inhaler Inhale 2 puffs into the lungs every 4 (four) hours as needed.   aspirin EC 81 MG tablet Take 1 tablet by mouth daily.   atenolol (TENORMIN) 25 MG tablet Take 25 mg by mouth 2 (two) times daily.   atorvastatin (LIPITOR) 80 MG tablet Take 0.5 tablets by mouth daily.   IRON PO Take 1 tablet by mouth every 7 (seven) days.   multivitamin-lutein (OCUVITE-LUTEIN) CAPS capsule Take 1 capsule by mouth daily.   pantoprazole (PROTONIX) 40 MG tablet Take 1 tablet by mouth daily.        .           Objective:        12/16/2020       141   11/26/2019        137  11/18/18 139 lb (63 kg)  01/27/18 139 lb 6.4 oz (63.2 kg)  12/13/17 133 lb (60.3 kg)      Vital signs reviewed  12/16/2020  - Note at rest 02 sats  98% on RA   General appearance:    amb pleasant wm nad     HEENT : pt wearing mask not removed for exam due to covid -19 concerns.    NECK :  without JVD/Nodes/TM/ nl carotid upstrokes bilaterally   LUNGS: no acc muscle use,  Nl contour chest  with very minimal pops/ squeaks post bases  bilaterally without cough on insp or exp maneuvers   CV:  RRR  no s3 or murmur or increase in P2, and no edema   ABD:  soft and nontender with nl inspiratory excursion in the supine position. No bruits or organomegaly appreciated, bowel sounds nl  MS:  Nl gait/ ext warm without deformities, calf tenderness, cyanosis - ?very subtle clubbing No obvious joint restrictions   SKIN: warm and dry without lesions    NEURO:  alert, approp, nl sensorium with  no motor or cerebellar deficits apparent.         CXR PA and Lateral:   12/16/2020 :    I personally reviewed images  / impression as follows:     C/w mild bronchiectasis, no acute changes         Assessment

## 2020-12-16 NOTE — Assessment & Plan Note (Addendum)
Onset was 2016 in setting of achalasia since around 1990   CT chest 09/07/14 bilateral lower lobe bronchiectasis assoc with achalasia  - 12/13/2017 Alpha one screen  164  MM  - 12/13/2017  quant IGs :  wnl - 12/13/2017   Quant GOLD Tb :  Neg  -  Allergy profile 12/13/2017 >  Eos 0.3 /  IgE 98  RAST pos ragweed, grass, trees   - PFT's  01/27/2018  FEV1 3.00 (106 % ) ratio 72  p no % improvement from saba p nothing prior to study with DLCO  87/91c % corrects to 85 % for alv volume   - added pepcid 20 mg one after supper 12/16/2020 >>>   Relatively well compensated but still having cough p supper on ppi ac q am so added pepcid 20 mg p supper and advised continued f/u by GI at [facility] for achalasia monitoring/ rx as this is greatest concern for exacerbations/progression of dz           Each maintenance medication was reviewed in detail including emphasizing most importantly the difference between maintenance and prns and under what circumstances the prns are to be triggered using an action plan format where appropriate.  Total time for H and P, chart review, counseling, ) and generating customized AVS unique to this office visit / same day charting = 26 min

## 2020-12-16 NOTE — Patient Instructions (Addendum)
Pepcid 20 mg taken when you finish supper to see if helps with the cough after supper  I recommend you have follow up for your Achalasia as it's likely to get worse as you get older  Please remember to go to the  x-ray department  for your tests - we will call you with the results when they are available     Please schedule a follow up visit in 12  months but call sooner if needed

## 2020-12-17 ENCOUNTER — Encounter: Payer: Self-pay | Admitting: Internal Medicine

## 2021-12-18 ENCOUNTER — Ambulatory Visit: Payer: Medicare Other | Admitting: Internal Medicine

## 2021-12-24 NOTE — Progress Notes (Unsigned)
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 80% better   With the residual seasonal rhinitis fall = spring s tendency to sinus  infections s asthma or need for inhalers  but around age 81 onset dysphagia > dx achalasia [facility] s/p dilations and maint on gerd rx / careful with diet >>  2016 noted variable avg qod sensation of needing to cough when bending over = a tsp or two white thick mucus > CT chest 09/07/14 bilateral lower lobe bronchiectasis but no change in meds then really bad cough mid August 2019 and gen chest tightness > dx as pna rx with 2 different  abx and feeling better but still abn cxr 11/28/17 so referred to pulmonary clinic.     History of Present Illness  12/13/2017  Pulmonary / 1st office eval  Chief Complaint  Patient presents with   Pulmonary Consult    Self referral.  Pt states he was dxed with PNA approx 6 wks ago. He states today he feels "the best I have felt in a year". He has minimal congestion "in my bronchial area"- non coughing up anything.   Dyspnea:  basoon capacity "75%" of what it used to be /  o/w  Not limited by breathing from desired activities   Cough: less than it's been in a year Sleep: L side/ 6 in blocks 2 pillows SABA use: once a week for tightness  rec GERD (REFLUX)  is an extremely common cause of respiratory symptoms just like yours , many times with no obvious heartburn at all.  Pantoprazole (protonix) 40 mg   Take  30-60 min before first meal of the day and Pepcid (famotidine)  20 mg / zantac 150 one @  bedtime until return to office - this is the best way to tell whether stomach acid is contributing to your problem.     11/26/2019  f/u ov/Terry Sparks re: bronchiectasis / achalasia  Chief Complaint  Patient presents with   Follow-up     Bronchiectasis w/o complication, no new issues  Dyspnea:  Walks 2 miles per day / 30 min / no hills  Cough: some sputum in am creamy  Sleeping: bed blocks and 2 pillows SABA use: very rarely 02: none  Rec No change rx    12/16/2020  f/u ov/Terry Sparks re: bronchiectasis /achalasia  maint on protonix ac   Chief Complaint  Patient presents with   Follow-up    More congestion than last office visit  Dyspnea:  walking 30 min daily no sob  Cough: after supper worse scant mucoid Sleeping: bed blocks 2 pillows cough does not wake him / no am flare SABA use: maybe twice a week seems to help tight sensation 02: none Covid status:   vax x 5  Rec Pepcid 20 mg taken when you finish supper to see if helps with the cough after supper I recommend you have follow up for your Achalasia as it's likely to get worse as you get older   12/25/2021  f/u ov/Terry Sparks re: ***   maint on ***  No chief complaint on file.   Dyspnea:  *** Cough: *** Sleeping: *** SABA use: *** 02: *** Covid status:   *** Lung cancer screening :  ***  No obvious day to day or daytime variability or assoc excess/ purulent sputum or mucus plugs or hemoptysis or cp or chest tightness, subjective wheeze or overt sinus or hb symptoms.   *** without nocturnal  or early am exacerbation  of respiratory  c/o's or need for noct saba. Also denies any obvious fluctuation of symptoms with weather or environmental changes or other aggravating or alleviating factors except as outlined above   No unusual exposure hx or h/o childhood pna/ asthma or knowledge of premature birth.  Current Allergies, Complete Past Medical History, Past Surgical History, Family History, and Social History were reviewed in [employer] [facility] record.  ROS  The following are not active complaints unless bolded Hoarseness, sore throat, dysphagia, dental problems, itching, sneezing,  nasal congestion or discharge of excess mucus or purulent  secretions, ear ache,   fever, chills, sweats, unintended wt loss or wt gain, classically pleuritic or exertional cp,  orthopnea pnd or arm/hand swelling  or leg swelling, presyncope, palpitations, abdominal pain, anorexia, nausea, vomiting, diarrhea  or change in bowel habits or change in bladder habits, change in stools or change in urine, dysuria, hematuria,  rash, arthralgias, visual complaints, headache, numbness, weakness or ataxia or problems with walking or coordination,  change in mood or  memory.        No outpatient medications have been marked as taking for the 12/25/21 encounter (Appointment) with Nyoka Cowden, MD.                  Objective:     Wts  12/25/2021      ***  12/16/2020       141   11/26/2019        137  11/18/18 139 lb (63 kg)  01/27/18 139 lb 6.4 oz (63.2 kg)  12/13/17 133 lb (60.3 kg)       minimal pops/ squeaks post bases  bilaterally - ?very subtle clubbing***            Assessment

## 2021-12-25 ENCOUNTER — Encounter: Payer: Self-pay | Admitting: Internal Medicine

## 2021-12-25 ENCOUNTER — Ambulatory Visit (INDEPENDENT_AMBULATORY_CARE_PROVIDER_SITE_OTHER): Payer: Medicare Other

## 2021-12-25 ENCOUNTER — Ambulatory Visit (INDEPENDENT_AMBULATORY_CARE_PROVIDER_SITE_OTHER): Payer: Medicare Other | Admitting: Internal Medicine

## 2021-12-25 DIAGNOSIS — J479 Bronchiectasis, uncomplicated: Secondary | ICD-10-CM

## 2021-12-25 MED ORDER — AZITHROMYCIN 250 MG PO TABS: ORAL_TABLET | ORAL | 11 refills | Status: DC

## 2021-12-25 MED ORDER — AZITHROMYCIN 250 MG PO TABS: ORAL_TABLET | ORAL | 0 refills | Status: DC

## 2021-12-25 NOTE — Patient Instructions (Signed)
Add pecpid 20 mg every evening sometime after supper  Best cough med = mucinex dm 1200 mg every 12 hours as needed  If mucus turns nasty > zpak   Please remember to go to the  x-ray department  for your tests - we will call you with the results when they are available    Please schedule a follow up visit in 12  months but call sooner if needed

## 2021-12-25 NOTE — Assessment & Plan Note (Addendum)
Onset was 2016 in setting of achalasia since around [employer] chest 09/07/14 bilateral lower lobe bronchiectasis assoc with achalasia  - 12/13/2017 Alpha one screen  164  MM  - 12/13/2017  quant IGs :  wnl - 12/13/2017   Quant GOLD Tb :  Neg  -  Allergy profile 12/13/2017 >  Eos 0.3 /  IgE 98  RAST pos ragweed, grass, trees   - PFT's  01/27/2018  FEV1 3.00 (106 % ) ratio 72  p no % improvement from saba p nothing prior to study with DLCO  87/91c % corrects to 85 % for alv volume   - added pepcid 20 mg one after supper 12/16/2020 >>> not taking consisently   - 12/25/2021 added Zpak prn nasty mucus and muxicinex dm prn  Plus pepcid 20 mg after supper and gi with [facility]   F/u yearly, sooner prn          Each maintenance medication was reviewed in detail including emphasizing most importantly the difference between maintenance and prns and under what circumstances the prns are to be triggered using an action plan format where appropriate.  Total time for H and P, chart review, counseling,   and generating customized AVS unique to this office visit / same day charting = 30 min annual eval for bronchiectasis

## 2022-12-26 ENCOUNTER — Ambulatory Visit: Payer: Medicare Other | Admitting: Internal Medicine

## 2023-01-07 ENCOUNTER — Ambulatory Visit (INDEPENDENT_AMBULATORY_CARE_PROVIDER_SITE_OTHER): Payer: Medicare Other | Admitting: Internal Medicine

## 2023-01-07 ENCOUNTER — Encounter: Payer: Self-pay | Admitting: Internal Medicine

## 2023-01-07 VITALS — BP 153/96 | HR 49 | Temp 97.4°F | Ht 69.5 in | Wt 141.2 lb

## 2023-01-07 DIAGNOSIS — J479 Bronchiectasis, uncomplicated: Secondary | ICD-10-CM | POA: Diagnosis not present

## 2023-01-07 NOTE — Patient Instructions (Signed)
Bring the printed CT results with you at your next visit.  Please schedule a follow up visit in 12 months but call sooner if needed

## 2023-01-07 NOTE — Assessment & Plan Note (Addendum)
Onset was 2016 in setting of achalasia since around 1990   CT chest 09/07/14 bilateral lower lobe bronchiectasis assoc with achalasia  - 12/13/2017 Alpha one screen  164  MM  - 12/13/2017  quant IGs :  wnl - 12/13/2017   Quant GOLD Tb :  Neg  -  Allergy profile 12/13/2017 >  Eos 0.3 /  IgE 98  RAST pos ragweed, grass, trees   - PFT's  01/27/2018  FEV1 3.00 (106 % ) ratio 72  p no % improvement from saba p nothing prior to study with DLCO  87/91c % corrects to 85 % for alv volume   - added pepcid 20 mg one after supper 12/16/2020 >>> not taking consisently  - 12/25/2021 added Zpak prn nasty mucus and muxicinex dm prn   .Adequate control on present rx, reviewed in detail with pt > no change in rx needed    F/u in 12 months, sooner if needed          Each maintenance medication was reviewed in detail including emphasizing most importantly the difference between maintenance and prns and under what circumstances the prns are to be triggered using an action plan format where appropriate.  Total time for H and P, chart review, counseling, and generating customized AVS unique to this office visit / same day charting = 

## 2023-01-07 NOTE — Progress Notes (Signed)
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% better   With the residual seasonal rhinitis fall = spring s tendency to sinus  infections s asthma or need for inhalers  but around age 82 onset dysphagia > dx achalasia [facility] s/p dilations and maint on gerd rx / careful with diet >>  2016 noted variable avg qod sensation of needing to cough when bending over = a tsp or two white thick mucus > CT chest 09/07/14 bilateral lower lobe bronchiectasis but no change in meds then really bad cough mid August 2019 and gen chest tightness > dx as pna rx with 2 different  abx and feeling better but still abn cxr 11/28/17 so referred to pulmonary clinic.     History of Present Illness  12/13/2017  Pulmonary / 1st office eval  Chief Complaint  Patient presents with   Pulmonary Consult    Self referral.  Pt states he was dxed with PNA approx 6 wks ago. He states today he feels "the best I have felt in a year". He has minimal congestion "in my bronchial area"- non coughing up anything.   Dyspnea:  basoon capacity "75%" of what it used to be /  o/w  Not limited by breathing from desired activities   Cough: less than it's been in a year Sleep: L side/ 6 in blocks 2 pillows SABA use: once a week for tightness  rec GERD (REFLUX)  is an extremely common cause of respiratory symptoms just like yours , many times with no obvious heartburn at all.  Pantoprazole (protonix) 40 mg   Take  30-60 min before first meal of the day and Pepcid (famotidine)  20 mg / zantac 150 one @  bedtime until return to office - this is the best way to tell whether stomach acid is contributing to your problem.       12/25/2021  f/u ov/Alina Gilkey re: bronchiectasis/achalasia  maint on protonix daily /  h2 prn seems to help  but not taking consistently  Chief Complaint  Patient presents with   Follow-up    Bronchiectasis. Chest congestion persistent   Dyspnea:  30 min  daily / improving tolerance Cough: 1 tsp daily / slt beige  Sleeping: on problem on bed blocks  SABA use: rarely  02: none  Rec Add pecpid 20 mg every evening sometime after supper Best cough med = mucinex dm 1200 mg every 12 hours as needed If mucus turns nasty > zpak  Please schedule a follow up visit in 12  months but call sooner if needed     01/07/2023  f/u ov/Deija Buhrman re: bronchiectasis/ achalasia maint on prn albuterol   Chief Complaint  Patient presents with   Follow-up  Dyspnea:  walking up to 30 min  daily  Cough:min  am production, nothing purulent  Sleeping: bed blocks s  resp cc  SABA use: none  02: none      No obvious day to day or daytime variability or assoc excess/ purulent sputum or mucus plugs or hemoptysis or cp or chest tightness, subjective wheeze or overt sinus or hb symptoms.    Also denies any obvious fluctuation of symptoms with weather or environmental changes or other aggravating or alleviating factors  except as outlined above   No unusual exposure hx or h/o childhood pna/ asthma or knowledge of premature birth.  Current Allergies, Complete Past Medical History, Past Surgical History, Family History, and Social History were reviewed in [employer] record.  ROS  The following are not active complaints unless bolded Hoarseness, sore throat, dysphagia, dental problems, itching, sneezing,  nasal congestion or discharge of excess mucus or purulent secretions, ear ache,   fever, chills, sweats, unintended wt loss or wt gain, classically pleuritic or exertional cp,  orthopnea pnd or arm/hand swelling  or leg swelling, presyncope, palpitations, abdominal pain, anorexia, nausea, vomiting, diarrhea  or change in bowel habits or change in bladder habits, change in stools or change in urine,  dysuria, hematuria,  rash, arthralgias, visual complaints, headache, numbness, weakness or ataxia or problems with walking or coordination,  change in mood or  memory.        Current Meds  Medication Sig   albuterol (VENTOLIN HFA) 108 (90 Base) MCG/ACT inhaler Inhale 2 puffs into the lungs every 4 (four) hours as needed.   aspirin EC 81 MG tablet Take 1 tablet by mouth daily.   atenolol (TENORMIN) 25 MG tablet Take 25 mg by mouth 2 (two) times daily.   atorvastatin (LIPITOR) 40 MG tablet Take 40 mg by mouth daily.   IRON PO Take 1 tablet by mouth every 7 (seven) days.   multivitamin-lutein (OCUVITE-LUTEIN) CAPS capsule Take 1 capsule by mouth daily.   pantoprazole (PROTONIX) 40 MG tablet Take 1 tablet by mouth daily.                Objective:     Wts  01/07/2023     141  12/25/2021     139  12/16/2020       141   11/26/2019        137  11/18/18 139 lb (63 kg)  01/27/18 139 lb 6.4 oz (63.2 kg)  12/13/17 133 lb (60.3 kg)    Vital signs reviewed  01/07/2023  - Note at rest 02 sats  95% on RA   General appearance:    amb wm nad       HEENT : Oropharynx  clear         NECK :  without  apparent JVD/ palpable Nodes/TM    LUNGS: no acc muscle use,  Nl contour chest with a few insp crackles/ rhonchi in bases    CV:  RRR  no s3 or murmur or increase in P2, and no edema   ABD:  soft and nontender    MS:  Nl gait/ ext warm without deformities Or obvious joint restrictions  calf tenderness, cyanosis or clubbing    SKIN: warm and dry without lesions    NEURO:  alert, approp, nl sensorium with  no motor or cerebellar deficits apparent.     CT "no change "  per pt report not available at time of ov thru [place]    Assessment

## 2023-06-16 IMAGING — DX DG CHEST 2V
2 series · 2 of 2 positions shown · non-contrast
Comparison: 11/26/2019

CLINICAL DATA: bronchiectasis

EXAM:
CHEST - 2 VIEW

[chest pa]
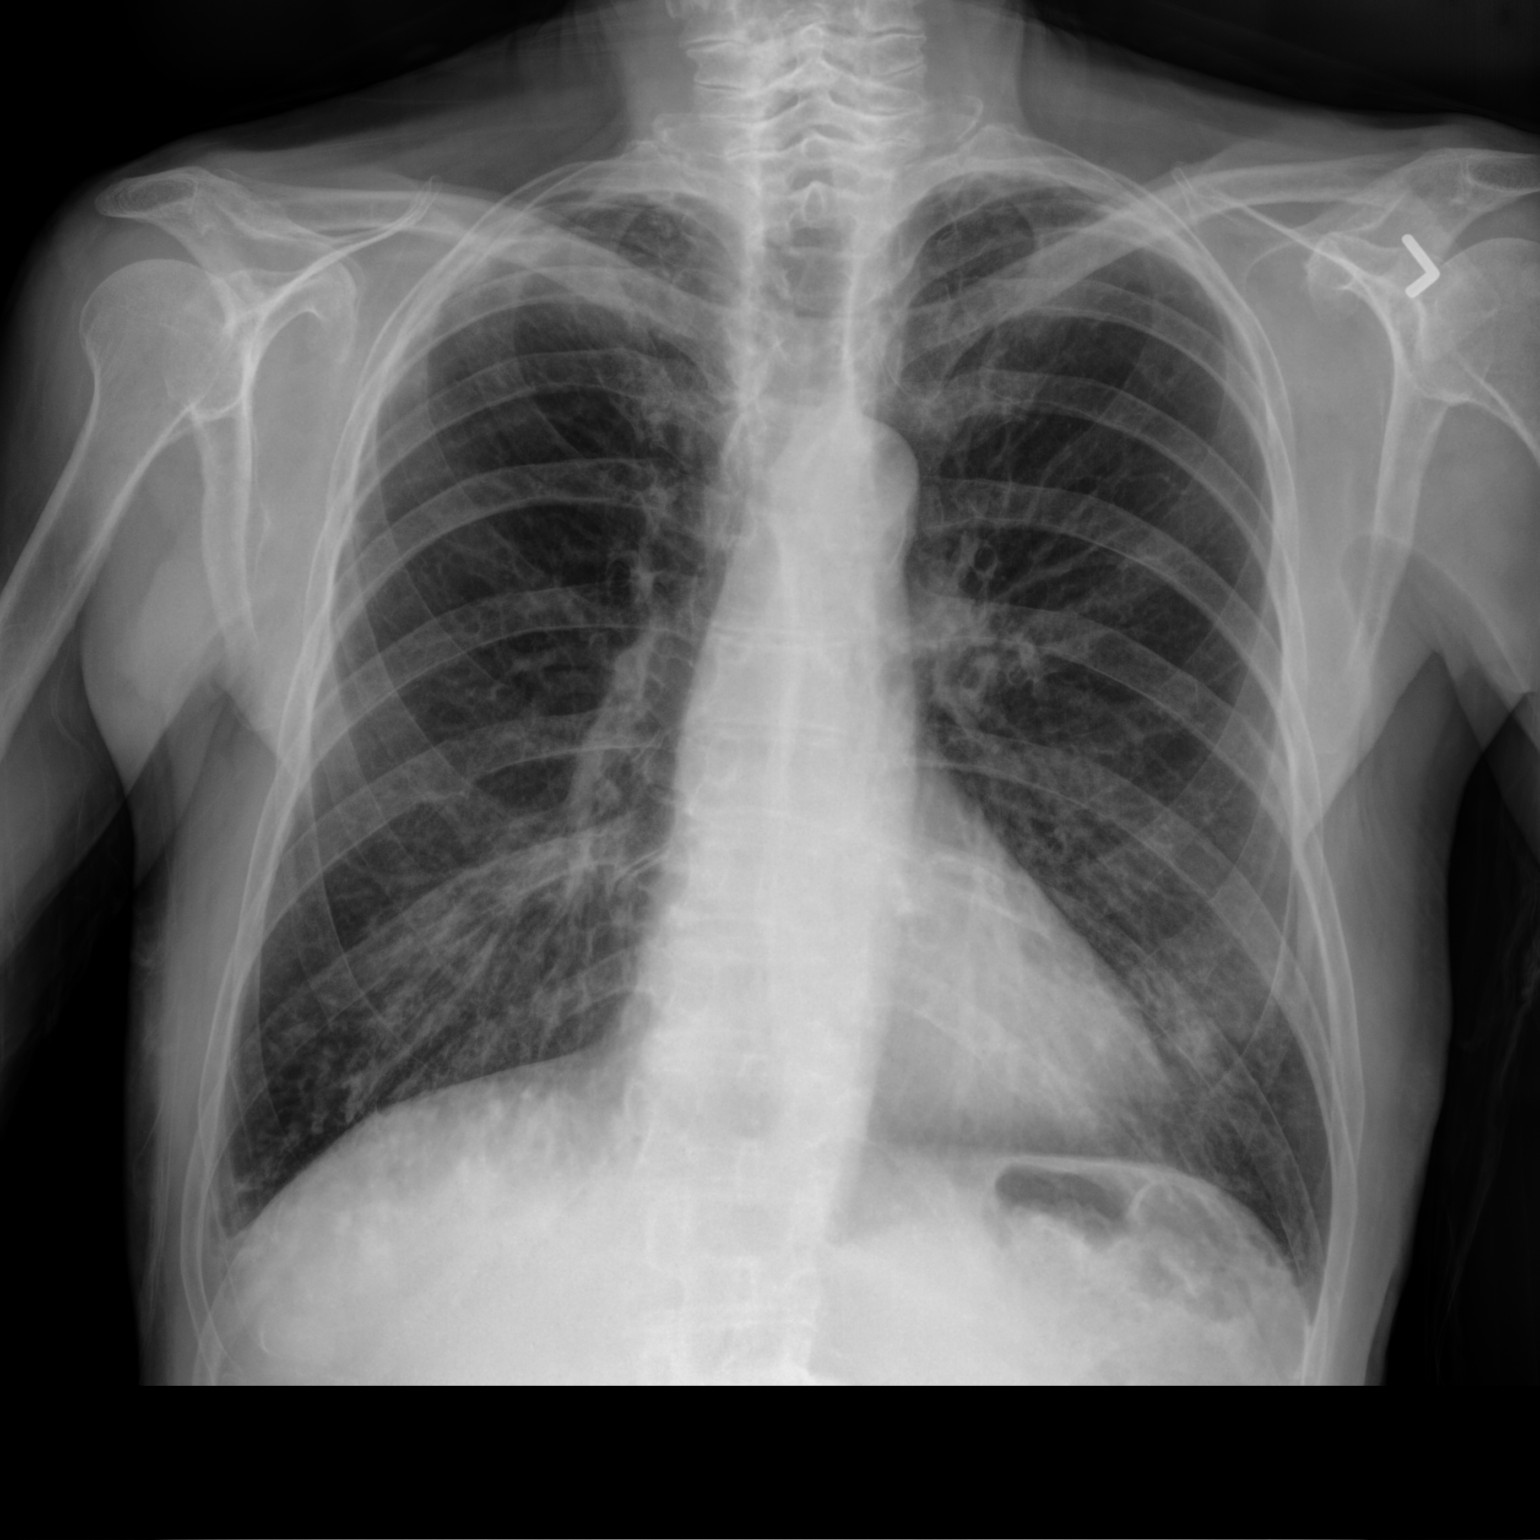

[chest lat]
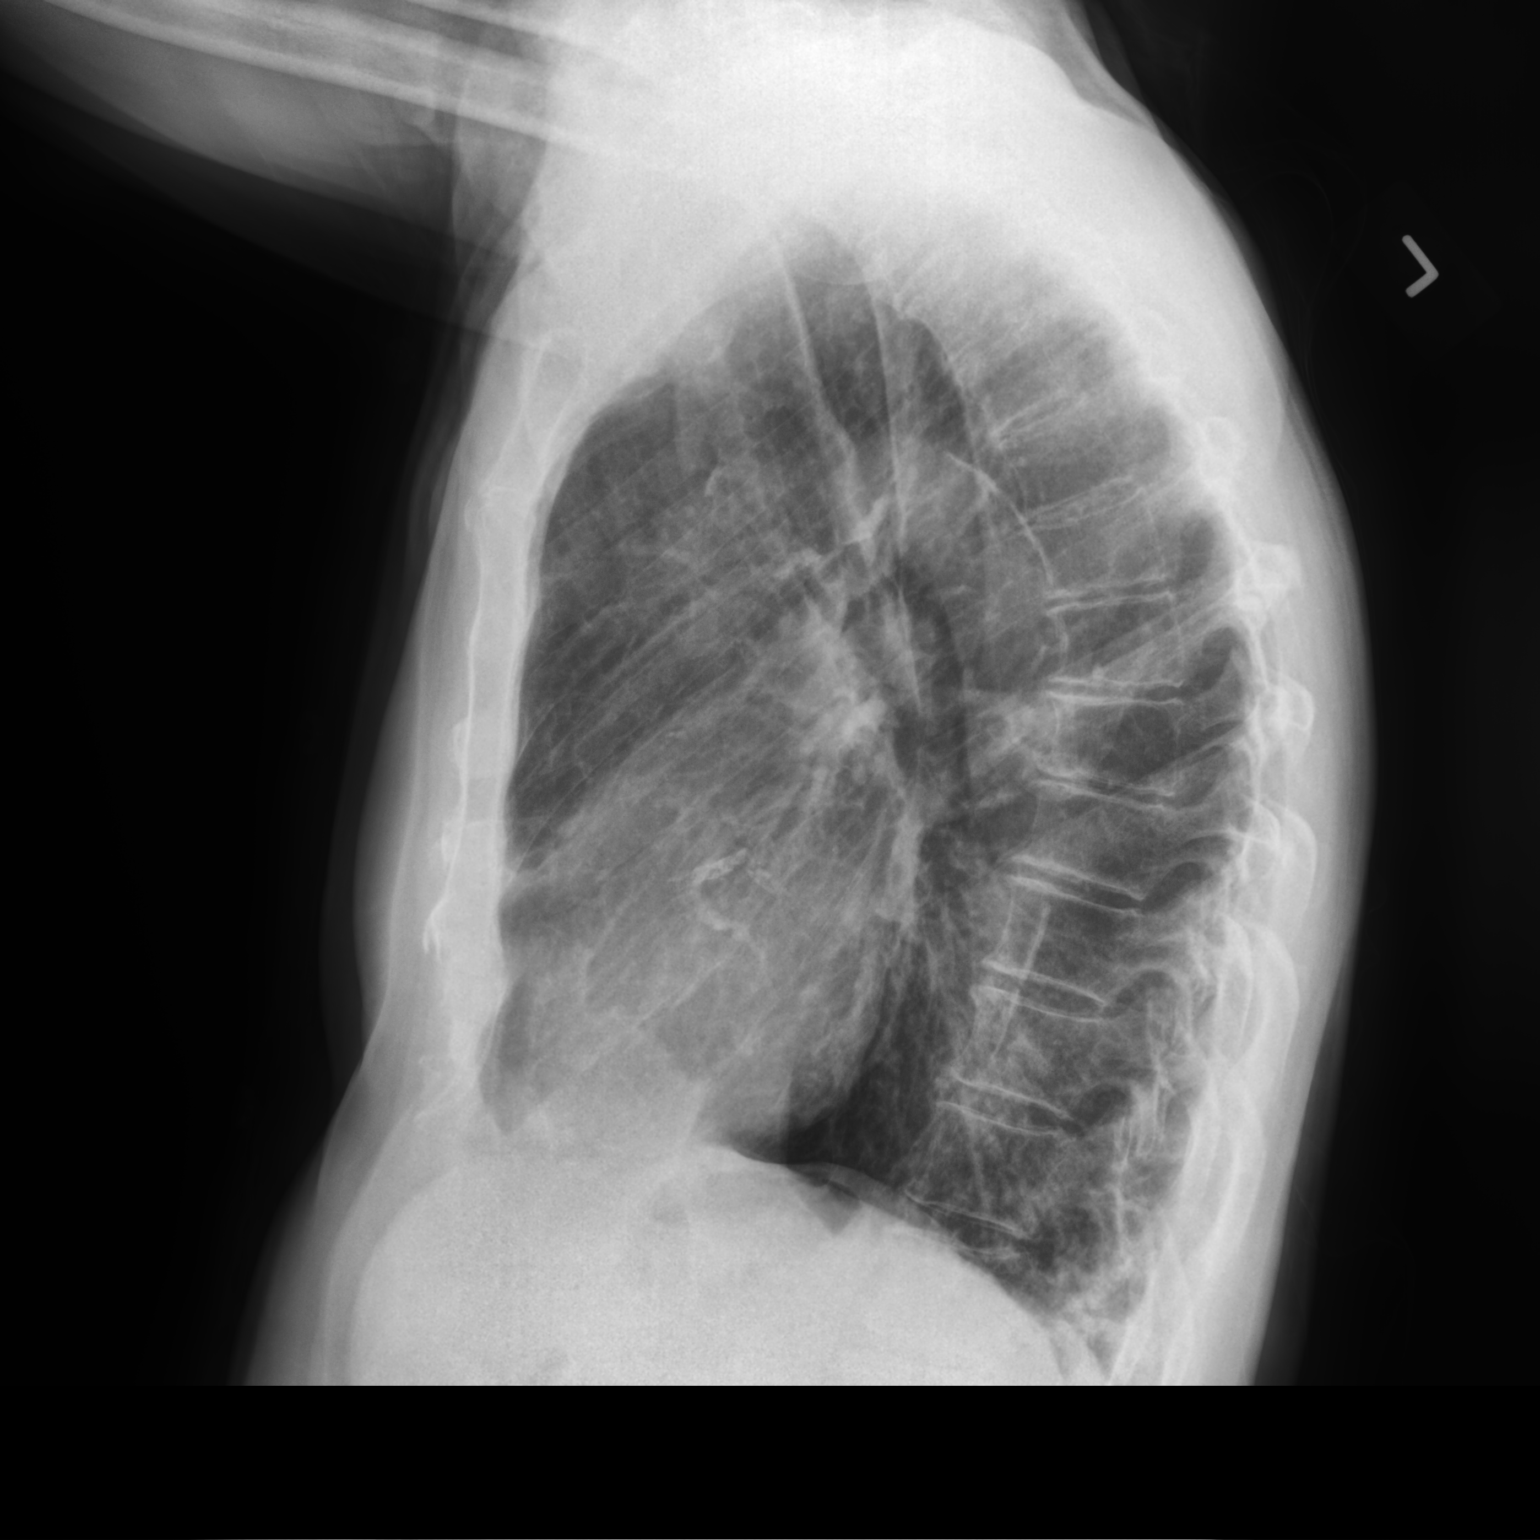

[2 of 2 positions shown; findings below may reference images not displayed]

FINDINGS: Unchanged cardiomediastinal silhouette. There is chronic bibasilar
airspace disease with bronchiectasis. No large pleural effusion or
visible pneumothorax. Bilateral shoulder degenerative changes.
Thoracic spondylosis. No acute osseous abnormality.
IMPRESSION: Chronic lower lung predominant bronchiectasis and bibasilar
opacities/scarring. No new acute findings.

## 2024-05-20 ENCOUNTER — Ambulatory Visit: Admitting: Internal Medicine
